# Patient Record
Sex: Male | Born: 1940 | Race: Black or African American | Hispanic: No | Marital: Single
Health system: Southern US, Community
[De-identification: ages and names within clinical notes are randomized; demographics above are authoritative.]

## PROBLEM LIST (undated history)

## (undated) DIAGNOSIS — C801 Malignant (primary) neoplasm, unspecified: Secondary | ICD-10-CM

## (undated) DIAGNOSIS — I10 Essential (primary) hypertension: Secondary | ICD-10-CM

## (undated) DIAGNOSIS — E119 Type 2 diabetes mellitus without complications: Secondary | ICD-10-CM

## (undated) HISTORY — PX: HERNIA REPAIR: SHX51

---

## 2017-09-18 ENCOUNTER — Encounter (HOSPITAL_COMMUNITY): Payer: Self-pay | Admitting: Emergency Medicine

## 2017-09-18 ENCOUNTER — Emergency Department (HOSPITAL_COMMUNITY): Payer: Medicare Other

## 2017-09-18 ENCOUNTER — Emergency Department (HOSPITAL_COMMUNITY)
Admission: EM | Admit: 2017-09-18 | Discharge: 2017-09-18 | Disposition: A | Discharge status: Home / Self Care | Payer: Medicare Other | Attending: Emergency Medicine | Admitting: Emergency Medicine

## 2017-09-18 DIAGNOSIS — H81399 Other peripheral vertigo, unspecified ear: Secondary | ICD-10-CM

## 2017-09-18 DIAGNOSIS — R42 Dizziness and giddiness: Secondary | ICD-10-CM | POA: Diagnosis present

## 2017-09-18 DIAGNOSIS — E119 Type 2 diabetes mellitus without complications: Secondary | ICD-10-CM | POA: Insufficient documentation

## 2017-09-18 DIAGNOSIS — R112 Nausea with vomiting, unspecified: Secondary | ICD-10-CM | POA: Insufficient documentation

## 2017-09-18 DIAGNOSIS — I10 Essential (primary) hypertension: Secondary | ICD-10-CM | POA: Diagnosis not present

## 2017-09-18 HISTORY — DX: Essential (primary) hypertension: I10

## 2017-09-18 HISTORY — DX: Type 2 diabetes mellitus without complications: E11.9

## 2017-09-18 HISTORY — DX: Malignant (primary) neoplasm, unspecified: C80.1

## 2017-09-18 LAB — URINALYSIS, ROUTINE W REFLEX MICROSCOPIC
BACTERIA UA: NONE SEEN
Bilirubin Urine: NEGATIVE
Glucose, UA: >=500 mg/dL — AB
HGB URINE DIPSTICK: NEGATIVE
KETONES UR: 5 mg/dL — AB
Leukocytes, UA: NEGATIVE
Nitrite: NEGATIVE
PROTEIN: NEGATIVE mg/dL
Specific Gravity, Urine: 1.017 (ref 1.005–1.030)
pH: 5 (ref 5.0–8.0)

## 2017-09-18 LAB — CBC
HEMATOCRIT: 47.9 % (ref 39.0–52.0)
HEMOGLOBIN: 16.4 g/dL (ref 13.0–17.0)
MCH: 28.8 pg (ref 26.0–34.0)
MCHC: 34.2 g/dL (ref 30.0–36.0)
MCV: 84 fL (ref 78.0–100.0)
Platelets: 283 10*3/uL (ref 150–400)
RBC: 5.7 MIL/uL (ref 4.22–5.81)
RDW: 14.2 % (ref 11.5–15.5)
WBC: 12.4 10*3/uL — ABNORMAL HIGH (ref 4.0–10.5)

## 2017-09-18 LAB — CBC WITH DIFFERENTIAL/PLATELET
BASOS ABS: 0 10*3/uL (ref 0.0–0.1)
Basophils Relative: 0 %
EOS PCT: 0 %
Eosinophils Absolute: 0 10*3/uL (ref 0.0–0.7)
HCT: 47.7 % (ref 39.0–52.0)
Hemoglobin: 16.1 g/dL (ref 13.0–17.0)
LYMPHS ABS: 2 10*3/uL (ref 0.7–4.0)
Lymphocytes Relative: 17 %
MCH: 28.5 pg (ref 26.0–34.0)
MCHC: 33.8 g/dL (ref 30.0–36.0)
MCV: 84.6 fL (ref 78.0–100.0)
MONO ABS: 0.7 10*3/uL (ref 0.1–1.0)
MONOS PCT: 6 %
Neutro Abs: 9.2 10*3/uL — ABNORMAL HIGH (ref 1.7–7.7)
Neutrophils Relative %: 77 %
Platelets: 294 10*3/uL (ref 150–400)
RBC: 5.64 MIL/uL (ref 4.22–5.81)
RDW: 14.1 % (ref 11.5–15.5)
WBC: 11.9 10*3/uL — ABNORMAL HIGH (ref 4.0–10.5)

## 2017-09-18 LAB — BASIC METABOLIC PANEL
ANION GAP: 14 (ref 5–15)
BUN: 23 mg/dL — AB (ref 6–20)
CHLORIDE: 99 mmol/L — AB (ref 101–111)
CO2: 23 mmol/L (ref 22–32)
Calcium: 9.5 mg/dL (ref 8.9–10.3)
Creatinine, Ser: 0.98 mg/dL (ref 0.61–1.24)
GFR calc Af Amer: >60 mL/min (ref >60)
GFR calc non Af Amer: >60 mL/min (ref >60)
GLUCOSE: 286 mg/dL — AB (ref 65–99)
POTASSIUM: 3.5 mmol/L (ref 3.5–5.1)
Sodium: 136 mmol/L (ref 135–145)

## 2017-09-18 LAB — I-STAT TROPONIN, ED
TROPONIN I, POC: 0.03 ng/mL (ref 0.00–0.08)
Troponin i, poc: 0 ng/mL (ref 0.00–0.08)

## 2017-09-18 MED ORDER — MECLIZINE HCL 25 MG PO TABS: 25.0000 mg | ORAL_TABLET | Freq: Three times a day (TID) | ORAL | 0 refills | Status: AC | PRN

## 2017-09-18 MED ORDER — ONDANSETRON HCL 4 MG/2ML IJ SOLN
4.0000 mg | INTRAMUSCULAR | Status: DC | PRN
  Administered 2017-09-18: 4 mg via INTRAVENOUS
  Filled 2017-09-18: qty 2

## 2017-09-18 MED ORDER — MECLIZINE HCL 25 MG PO TABS
25.0000 mg | ORAL_TABLET | Freq: Once | ORAL | Status: AC
  Administered 2017-09-18: 25 mg via ORAL
  Filled 2017-09-18: qty 1

## 2017-09-18 MED ORDER — SODIUM CHLORIDE 0.9 % IV BOLUS
500.0000 mL | Freq: Once | INTRAVENOUS | Status: AC
  Administered 2017-09-18: 500 mL via INTRAVENOUS

## 2017-09-18 MED ORDER — SODIUM CHLORIDE 0.9 % IV SOLN
INTRAVENOUS | Status: DC
  Administered 2017-09-18: 17:00:00 via INTRAVENOUS

## 2017-09-18 MED ORDER — ONDANSETRON 4 MG PO TBDP: 4.0000 mg | ORAL_TABLET | Freq: Three times a day (TID) | ORAL | 0 refills | Status: AC | PRN

## 2017-09-18 NOTE — ED Triage Notes (Signed)
Patient c/o SOB, dull central chest pain, dizziness, N/V since yesterday. Reports dizziness worsens with movement. Denies headache.

## 2017-09-18 NOTE — ED Notes (Signed)
Pt ambulated to and from the bathroom with no difficulty, does still complain of some dizziness and double vision when he watches TV but notices an improvement

## 2017-09-18 NOTE — Discharge Instructions (Addendum)
Take the prescriptions as directed.  Call your regular medical doctor tomorrow to schedule a follow up appointment within the next 2 days. Call the Eye doctor tomorrow to schedule a follow up appointment this week. Call the ENT doctor tomorrow to schedule a follow up appointment within the next week.  Return to the Emergency Department immediately sooner if worsening.

## 2017-09-18 NOTE — ED Provider Notes (Addendum)
[facility] DEPT Provider Note   CSN: [id number] Arrival date & time: 09/18/17  1129     History   Chief Complaint Chief Complaint  Patient presents with  . Shortness of Breath  . Dizziness    HPI Peter Olsen is a 77 y.o. male.  HPI  Pt was seen at 1605. Per pt, c/o unknown onset and persistence of constant "dizziness" that was noticed when he woke up yesterday morning. Pt describes the dizziness as "everything is spinning around," worsens when he moves his head or tries to walk. Has been associated with feeling "off balance" when walking, as well as several intermittent episodes of N/V. Pt states his CBG's have been in the "200's." Denies CP/palpitations, no SOB/cough, no abd pain, no diarrhea, no black or blood in stools or emesis, no fevers, no rash, no falls, no focal motor weakness, no tingling/numbness in extremities, no slurred speech, no facial droop.    Past Medical History:  Diagnosis Date  . Cancer ([place])   . Diabetes mellitus without complication ([place])   . Hypertension     There are no active problems to display for this patient.   Past Surgical History:  Procedure Laterality Date  . HERNIA REPAIR          Home Medications    Prior to Admission medications   Not on File    Family History No family history on file.  Social History Social History   Tobacco Use  . Smoking status: Not on file  Substance Use Topics  . Alcohol use: Not on file  . Drug use: Not on file     Allergies   Patient has no known allergies.   Review of Systems Review of Systems ROS: Statement: All systems negative except as marked or noted in the HPI; Constitutional: Negative for fever and chills. ; ; Eyes: Negative for eye pain, redness and discharge. ; ; ENMT: Negative for ear pain, hoarseness, nasal congestion, sinus pressure and sore throat. ; ; Cardiovascular: Negative for chest pain, palpitations, diaphoresis, dyspnea and peripheral  edema. ; ; Respiratory: Negative for cough, wheezing and stridor. ; ; Gastrointestinal: +N/V. Negative for diarrhea, abdominal pain, blood in stool, hematemesis, jaundice and rectal bleeding. . ; ; Genitourinary: Negative for dysuria, flank pain and hematuria. ; ; Musculoskeletal: Negative for back pain and neck pain. Negative for swelling and trauma.; ; Skin: Negative for pruritus, rash, abrasions, blisters, bruising and skin lesion.; ; Neuro: +"spinning." Negative for headache, lightheadedness and neck stiffness. Negative for weakness, altered level of consciousness, altered mental status, extremity weakness, paresthesias, involuntary movement, seizure and syncope.       Physical Exam Updated Vital Signs BP 131/78 (BP Location: Right Arm)   Pulse 62   Temp 97.8 F (36.6 C) (Oral)   Resp 18   SpO2 98%     16:27 Orthostatic Vital Signs AL  Orthostatic Lying   BP- Lying: 182/82   Pulse- Lying: 60   16:30 Orthostatic Vital Signs AL  Orthostatic Sitting  BP- Sitting: 168/99  Pulse- Sitting: 69   16:32 Orthostatic Vital Signs AL  Orthostatic Standing at 0 minutes  BP- Standing at 0 minutes: 156/80   Pulse- Standing at 0 minutes: 71     Physical Exam 1610: Physical examination:  Nursing notes reviewed; Vital signs and O2 SAT reviewed;  Constitutional: Well developed, Well nourished, Well hydrated, In no acute distress; Head:  Normocephalic, atraumatic; Eyes: EOMI, PERRL, No scleral icterus; ENMT: TM's clear bilat. +  edemetous nasal turbinates bilat with clear rhinorrhea. Mouth and pharynx normal, Mucous membranes moist; Neck: Supple, Full range of motion, No lymphadenopathy; Cardiovascular: Regular rate and rhythm, No gallop; Respiratory: Breath sounds clear & equal bilaterally, No wheezes.  Speaking full sentences with ease, Normal respiratory effort/excursion; Chest: Nontender, Movement normal; Abdomen: Soft, Nontender, Nondistended, Normal bowel sounds; Genitourinary: No CVA tenderness;  Extremities: Peripheral pulses normal, No tenderness, No edema, No calf edema or asymmetry.; Neuro: AA&Ox3, Major CN grossly intact. Speech clear.  No facial droop.  +left horizontal end gaze fatigable nystagmus. Grips equal. Strength 5/5 equal bilat UE's and LE's.  DTR 2/4 equal bilat UE's and LE's.  No gross sensory deficits.  Normal cerebellar testing bilat UE's (finger-nose) and LE's (heel-shin).; Skin: Color normal, Warm, Dry.   ED Treatments / Results  Labs (all labs ordered are listed, but only abnormal results are displayed)   EKG EKG Interpretation  Date/Time:  Monday September 18 2017 11:45:21 EDT Ventricular Rate:  66 PR Interval:    QRS Duration: 75 QT Interval:  421 QTC Calculation: 442 R Axis:   62 Text Interpretation:  Sinus rhythm Prolonged PR interval RSR' in V1 or V2, right VCD or RVH Baseline wander No old tracing to compare Confirmed by Francine Graven [phone]) on 09/18/2017 4:14:49 PM   Radiology   Procedures Procedures (including critical care time)  Medications Ordered in ED Medications  meclizine (ANTIVERT) tablet 25 mg (has no administration in time range)  ondansetron (ZOFRAN) injection 4 mg (has no administration in time range)  0.9 %  sodium chloride infusion (has no administration in time range)  sodium chloride 0.9 % bolus 500 mL (has no administration in time range)     Initial Impression / Assessment and Plan / ED Course  I have reviewed the triage vital signs and the nursing notes.  Pertinent labs & imaging results that were available during my care of the patient were reviewed by me and considered in my medical decision making (see chart for details).  MDM Reviewed: previous chart, nursing note and vitals Reviewed previous: labs and ECG Interpretation: labs, ECG, x-ray and MRI   Results for orders placed or performed during the hospital encounter of 60/45/40  Basic metabolic panel  Result Value Ref Range   Sodium 136 135 - 145 mmol/L    Potassium 3.5 3.5 - 5.1 mmol/L   Chloride 99 (L) 101 - 111 mmol/L   CO2 23 22 - 32 mmol/L   Glucose, Bld 286 (H) 65 - 99 mg/dL   BUN 23 (H) 6 - 20 mg/dL   Creatinine, Ser 0.98 0.61 - 1.24 mg/dL   Calcium 9.5 8.9 - 10.3 mg/dL   GFR calc non Af Amer >60 >60 mL/min   GFR calc Af Amer >60 >60 mL/min   Anion gap 14 5 - 15  CBC  Result Value Ref Range   WBC 12.4 (H) 4.0 - 10.5 K/uL   RBC 5.70 4.22 - 5.81 MIL/uL   Hemoglobin 16.4 13.0 - 17.0 g/dL   HCT 47.9 39.0 - 52.0 %   MCV 84.0 78.0 - 100.0 fL   MCH 28.8 26.0 - 34.0 pg   MCHC 34.2 30.0 - 36.0 g/dL   RDW 14.2 11.5 - 15.5 %   Platelets 283 150 - 400 K/uL  Urinalysis, Routine w reflex microscopic  Result Value Ref Range   Color, Urine YELLOW YELLOW   APPearance CLEAR CLEAR   Specific Gravity, Urine 1.017 1.005 - 1.030   pH 5.0  5.0 - 8.0   Glucose, UA >=500 (A) NEGATIVE mg/dL   Hgb urine dipstick NEGATIVE NEGATIVE   Bilirubin Urine NEGATIVE NEGATIVE   Ketones, ur 5 (A) NEGATIVE mg/dL   Protein, ur NEGATIVE NEGATIVE mg/dL   Nitrite NEGATIVE NEGATIVE   Leukocytes, UA NEGATIVE NEGATIVE   RBC / HPF 0-5 0 - 5 RBC/hpf   WBC, UA 0-5 0 - 5 WBC/hpf   Bacteria, UA NONE SEEN NONE SEEN   Squamous Epithelial / LPF 0-5 (A) NONE SEEN   Mucus PRESENT    Hyaline Casts, UA PRESENT   CBC with Differential/Platelet  Result Value Ref Range   WBC 11.9 (H) 4.0 - 10.5 K/uL   RBC 5.64 4.22 - 5.81 MIL/uL   Hemoglobin 16.1 13.0 - 17.0 g/dL   HCT 47.7 39.0 - 52.0 %   MCV 84.6 78.0 - 100.0 fL   MCH 28.5 26.0 - 34.0 pg   MCHC 33.8 30.0 - 36.0 g/dL   RDW 14.1 11.5 - 15.5 %   Platelets 294 150 - 400 K/uL   Neutrophils Relative % 77 %   Neutro Abs 9.2 (H) 1.7 - 7.7 K/uL   Lymphocytes Relative 17 %   Lymphs Abs 2.0 0.7 - 4.0 K/uL   Monocytes Relative 6 %   Monocytes Absolute 0.7 0.1 - 1.0 K/uL   Eosinophils Relative 0 %   Eosinophils Absolute 0.0 0.0 - 0.7 K/uL   Basophils Relative 0 %   Basophils Absolute 0.0 0.0 - 0.1 K/uL  I-stat troponin, ED    Result Value Ref Range   Troponin i, poc 0.03 0.00 - 0.08 ng/mL   Comment 3          I-stat troponin, ED  Result Value Ref Range   Troponin i, poc 0.00 0.00 - 0.08 ng/mL   Comment 3           Dg Chest 2 View Result Date: 09/18/2017 CLINICAL DATA:  Shortness of breath, dizziness, dull mid chest pain, and nausea vomiting since yesterday. History of hypertension and diabetes. EXAM: CHEST - 2 VIEW COMPARISON:  None in PACs FINDINGS: The lungs are adequately inflated. The interstitial markings are coarse especially at the lung bases. The heart is top-normal in size. The pulmonary vascularity is normal. The mediastinum is normal in width. The bony thorax exhibits no acute abnormality. IMPRESSION: Mild interstitial prominence likely reflects chronic bronchitis. There is no alveolar pneumonia nor pulmonary edema. Electronically Signed   By: David  Martinique M.D.   On: 09/18/2017 12:10   Mr Brain Wo Contrast (neuro Protocol) Result Date: 09/18/2017 CLINICAL DATA:  Vertigo, episodic. EXAM: MRI HEAD WITHOUT CONTRAST TECHNIQUE: Multiplanar, multiecho pulse sequences of the brain and surrounding structures were obtained without intravenous contrast. COMPARISON:  None. FINDINGS: Brain: No evidence for acute infarction, hemorrhage, mass lesion, hydrocephalus, or extra-axial fluid. Normal for age cerebral volume. Mild subcortical and periventricular T2 and FLAIR hyperintensities, likely chronic microvascular ischemic change. There does not appear to be significant white matter change involving the brainstem, nor is there evidence for prior brainstem infarction. Vascular: Flow voids are maintained throughout the carotid, basilar, and vertebral arteries. There are no areas of chronic hemorrhage. LEFT vertebral dominant. Skull and upper cervical spine: Unremarkable visualized calvarium, skullbase, and cervical vertebrae. Pituitary, pineal, cerebellar tonsils unremarkable. No upper cervical cord lesions. Sinuses/Orbits:  Negative. Other: No temporal bone inflammatory process is evident. Extracranial soft tissues appear unremarkable. IMPRESSION: Noncontrast examination of the brain, demonstrating mild atrophy and small vessel disease. No acute  intracranial findings. Electronically Signed   By: Staci Righter M.D.   On: 09/18/2017 19:08    2015:  Pt denies CP to me; but troponin x2 normal and EKG without acute STTW changes. MRI reassuring. Pt given PO antivert and then ambulated. Pt states his symptoms are better. States he "still feels the spinning a little bit and the double vision still happens." Pt states the double vision (sees 2 images side to side) occurs when the spinning occurs, resolves when the spinning does and when he covers one eye. Denies eye pain, no headache, no FB sensation. Pt has tol PO well while in the ED without N/V. T/C returned from Neuro Dr. Cheral Marker, case discussed, including:  HPI, pertinent PM/SHx, VS/PE, dx testing, ED course and treatment:  Agrees negative MRI has ruled out acute stroke and TIA, no further emergent testing needed at this time, continue to tx for peripheral vertigo, f/u ENT MD.   2030:  T/C returned from Ophtho Dr. Kristeen Miss, case discussed, including:  HPI, pertinent PM/SHx, VS/PE, dx testing, ED course and treatment:  Agrees that with reassuring MRI, no further imaging or dx testing needed at this time, double vision d/t peripheral vertigo or possible misalignment, tx vertigo and f/u in eye clinic this week as well as with ENT MD. Dx and testing d/w pt and family.  Questions answered.  Verb understanding, agreeable to d/c home with outpt f/u.     Final Clinical Impressions(s) / ED Diagnoses   Final diagnoses:  None    ED Discharge Orders    None          Francine Graven, DO 09/20/17 1826

## 2019-06-24 IMAGING — CR DG CHEST 2V
3 series · 3 of 3 positions shown · non-contrast
Comparison: None in PACs

CLINICAL DATA: Shortness of breath, dizziness, dull mid chest pain,
and nausea vomiting since yesterday. History of hypertension and
diabetes.

EXAM:
CHEST - 2 VIEW

[w chest lat (1 of 2)]
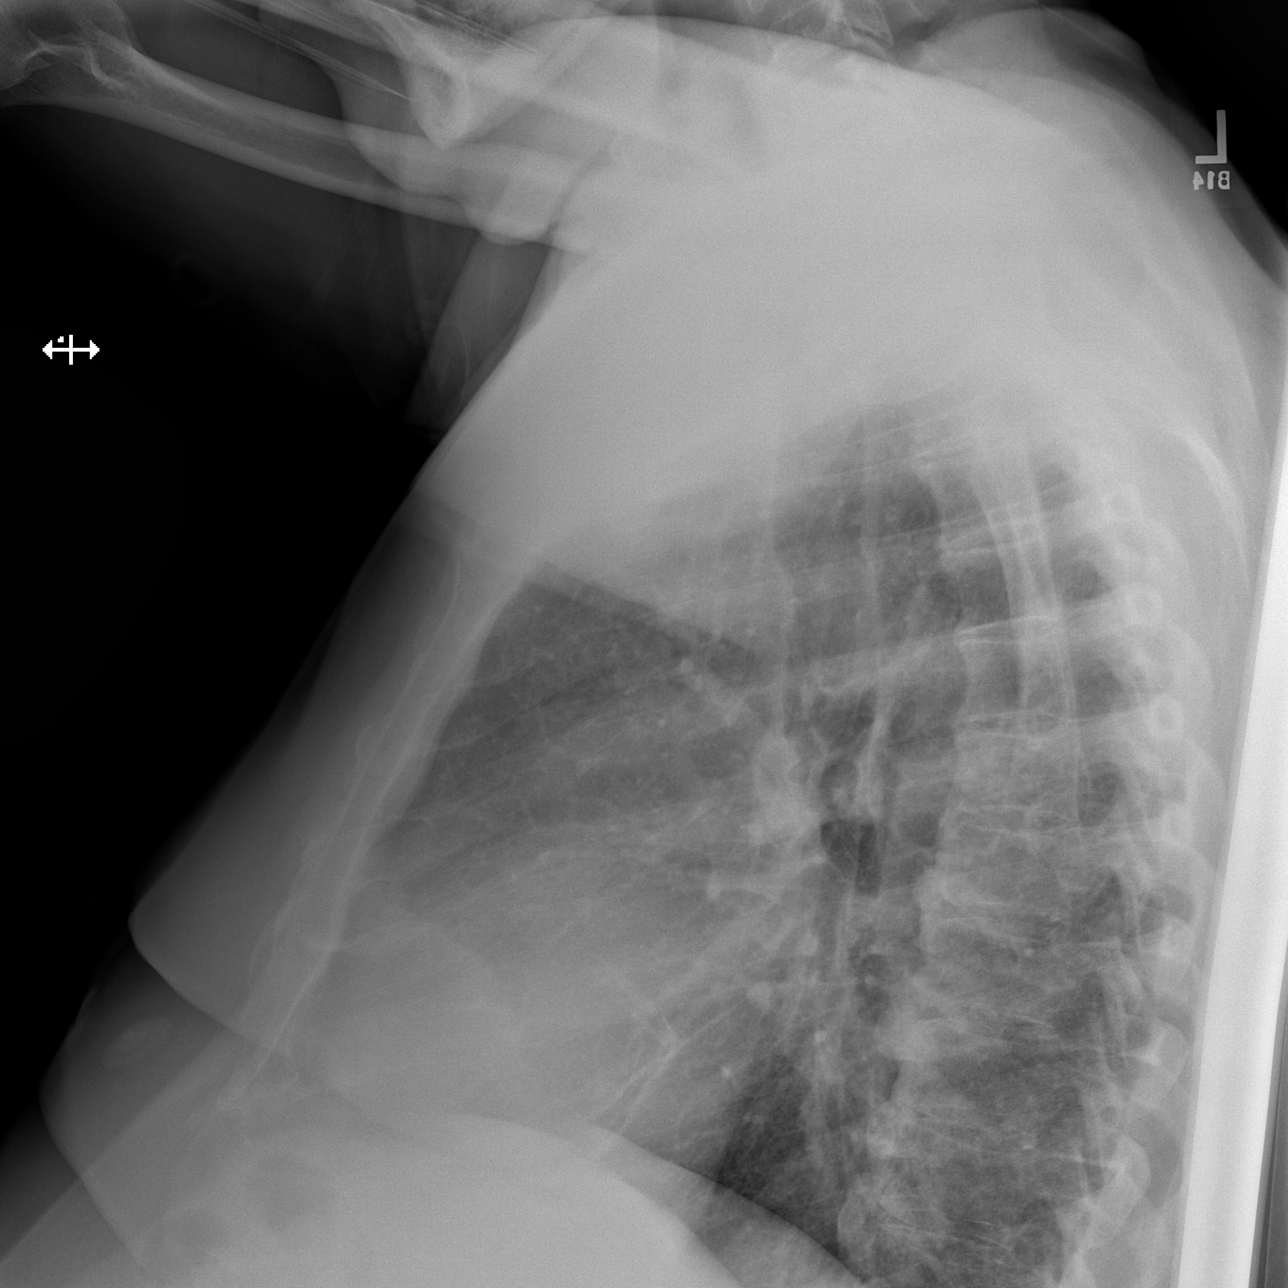

[w chest lat (2 of 2)]
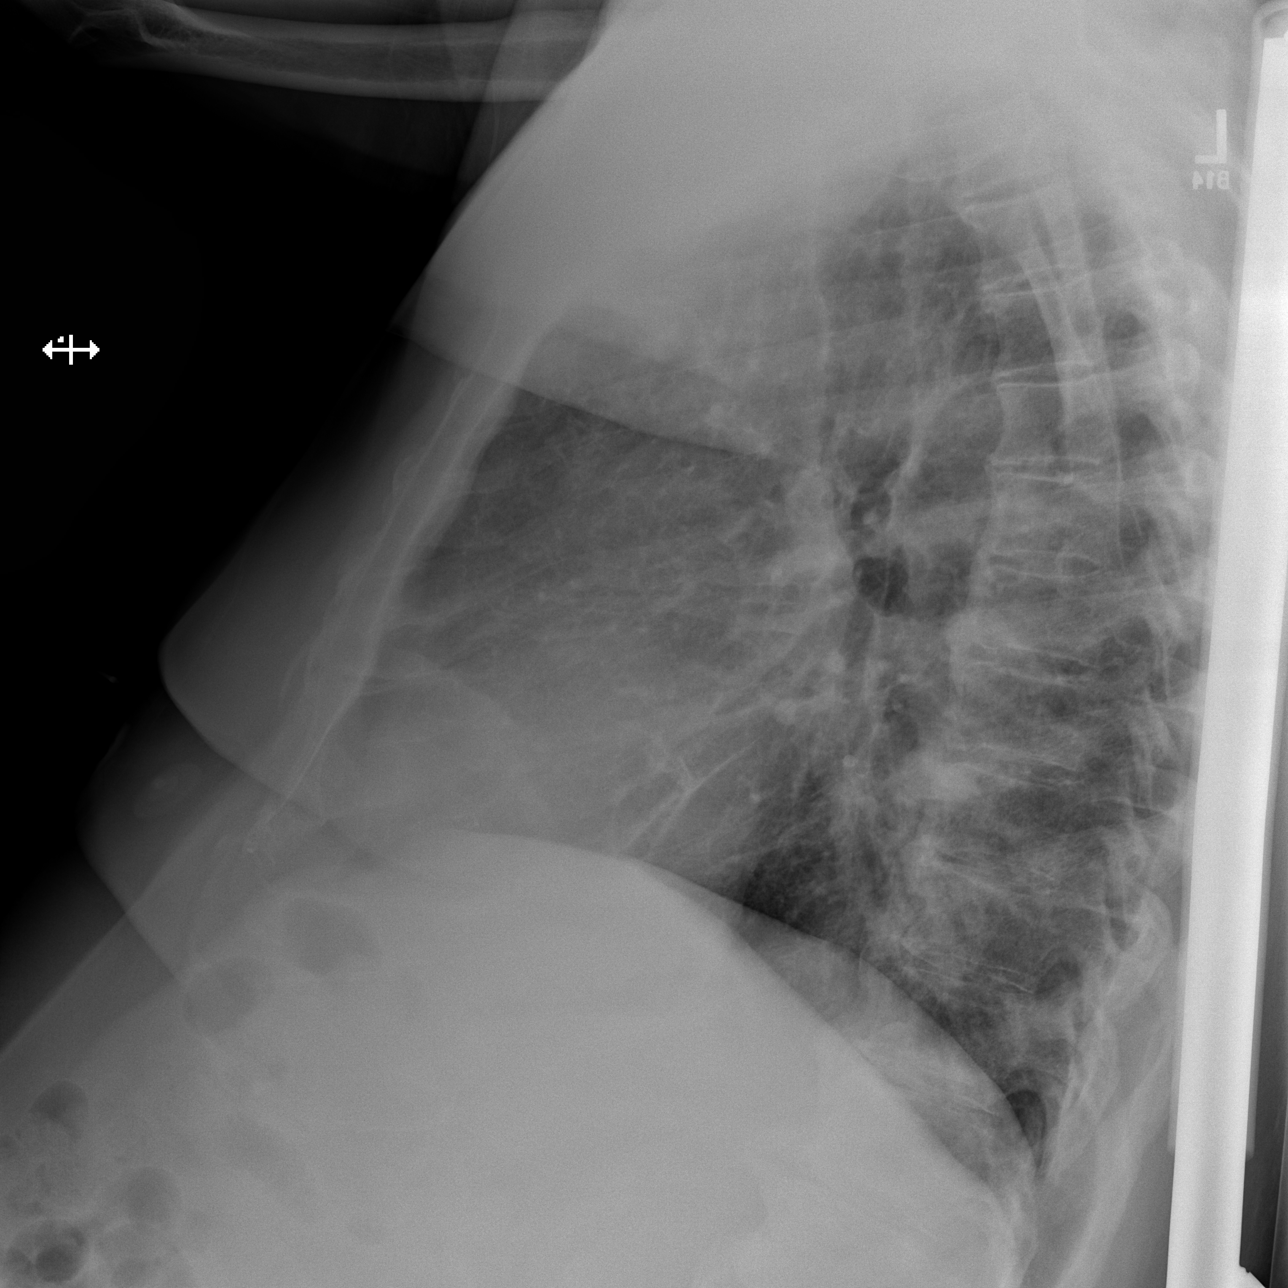

[x chest ap]
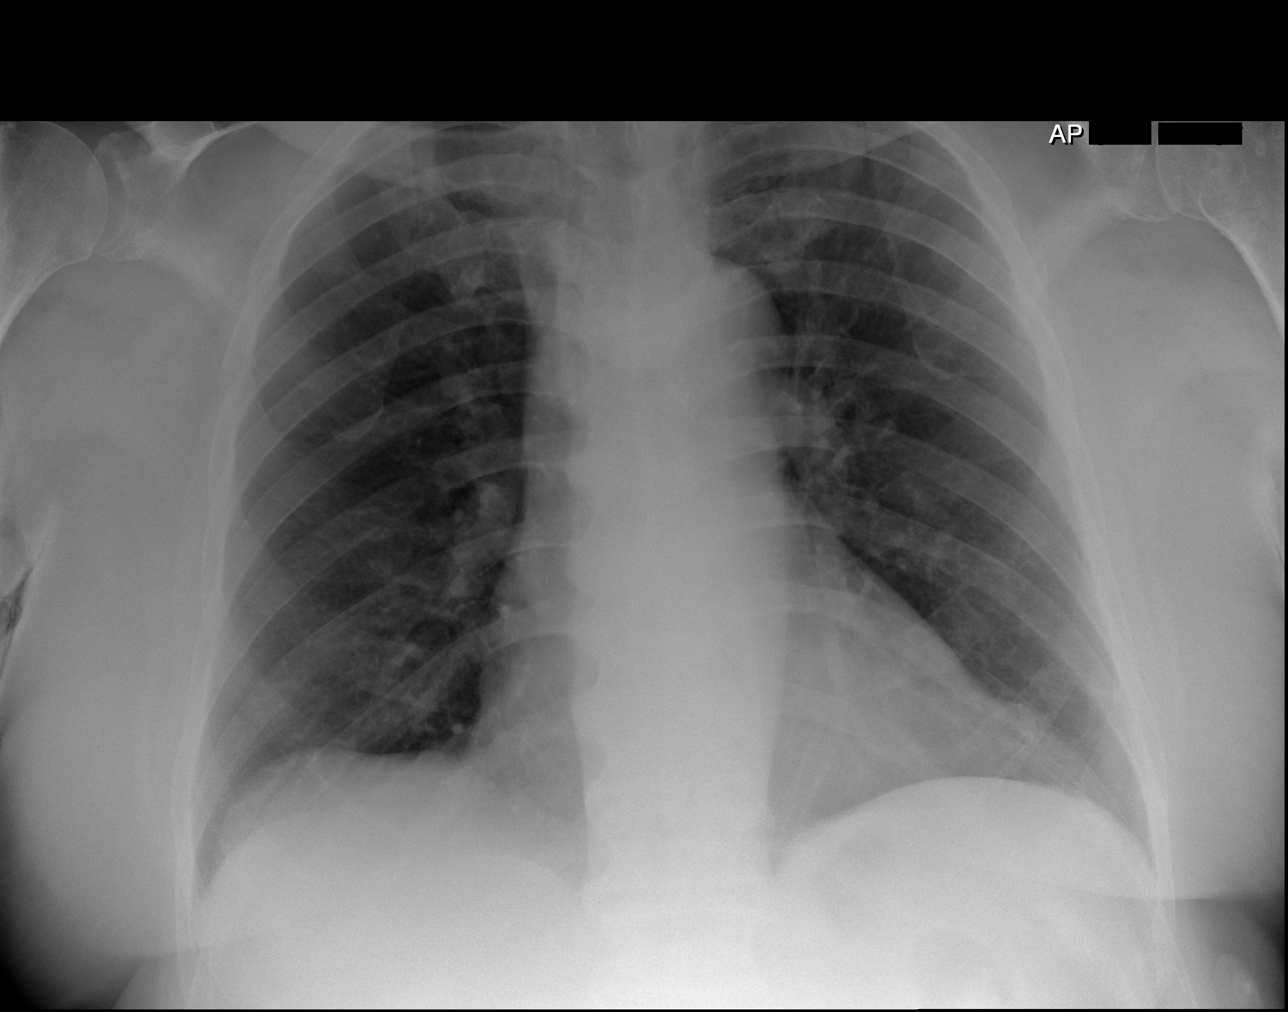

[3 of 3 positions shown; findings below may reference images not displayed]

FINDINGS: The lungs are adequately inflated. The interstitial markings are
coarse especially at the lung bases. The heart is top-normal in
size. The pulmonary vascularity is normal. The mediastinum is normal
in width. The bony thorax exhibits no acute abnormality.
IMPRESSION: Mild interstitial prominence likely reflects chronic bronchitis.
There is no alveolar pneumonia nor pulmonary edema.

## 2019-06-24 IMAGING — MR MR HEAD W/O CM
9 of 10 series · 38 of 48 positions shown · non-contrast
Comparison: None.

CLINICAL DATA: Vertigo, episodic.

EXAM:
MRI HEAD WITHOUT CONTRAST
TECHNIQUE: Multiplanar, multiecho pulse sequences of the brain and surrounding
structures were obtained without intravenous contrast.

[Series 3: DWI · axial · 3.0mm · 1.09mm/px · z∈[-101,+52]mm · 9 of 104 slices shown (1 of 4)]
[im 1/104]
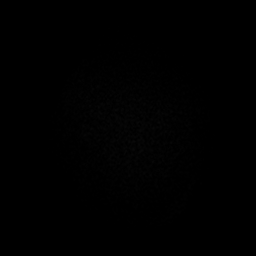
[im 19/104]
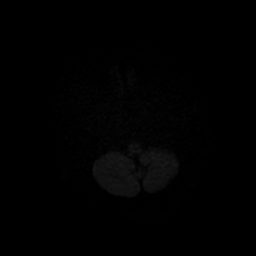
[im 29/104]
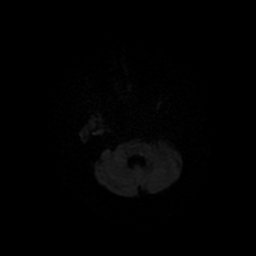
[im 47/104]
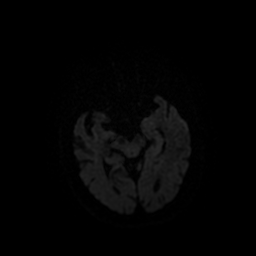
[im 57/104]
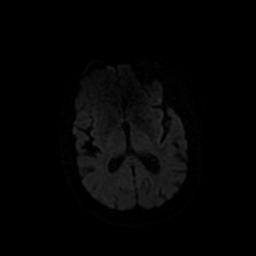
[im 75/104]
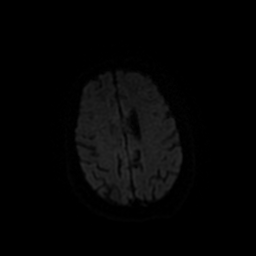
[im 85/104]
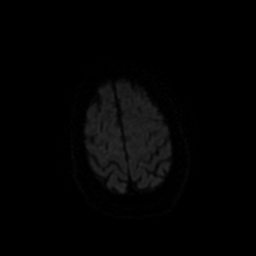
[im 94/104]
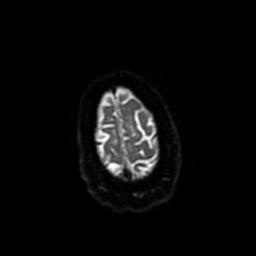
[im 104/104]
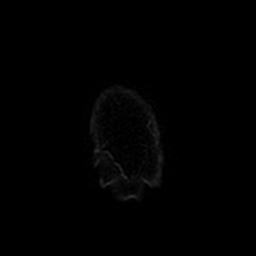

[Series 4: T1 · sagittal · 5.0mm · 0.47mm/px · 3 of 24 slices shown]
[im 1/24]
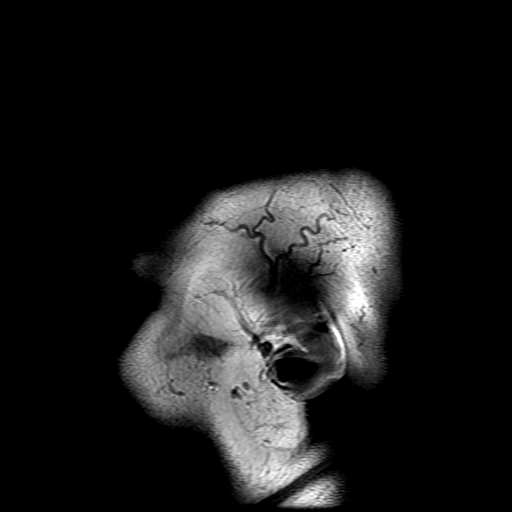
[im 12/24]
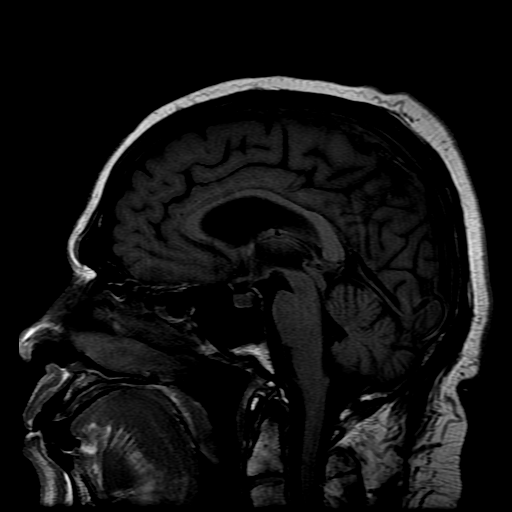
[im 24/24]
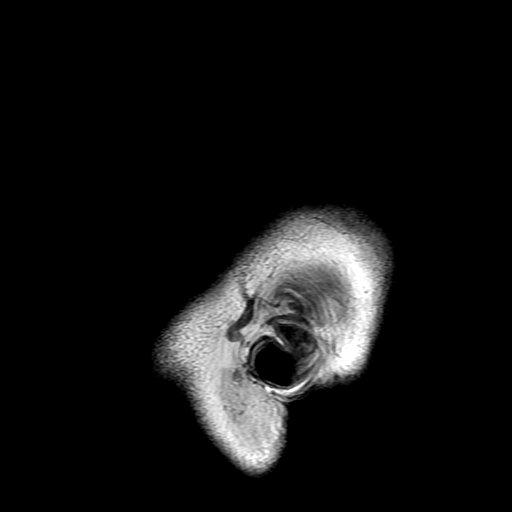

[Series 5: DWI · coronal · 5.0mm · 1.09mm/px · 8 of 72 slices shown (2 of 4)]
[im 1/72]
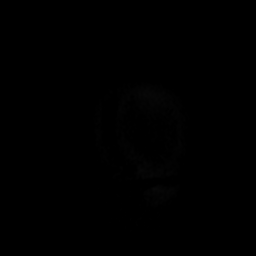
[im 11/72]
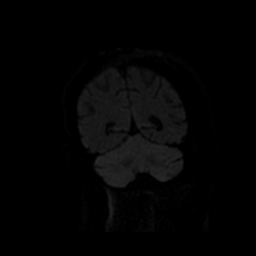
[im 21/72]
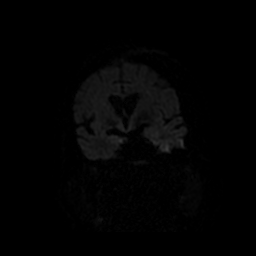
[im 31/72]
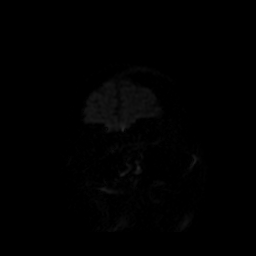
[im 41/72]
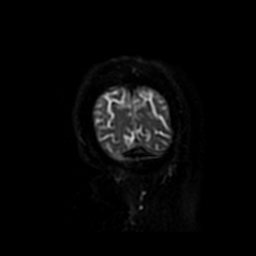
[im 51/72]
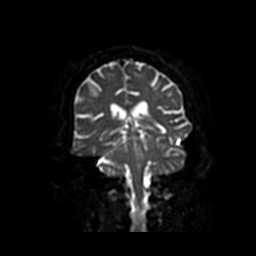
[im 61/72]
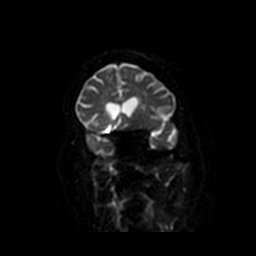
[im 72/72]
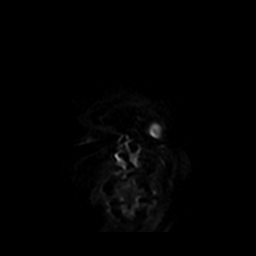

[Series 6: T2 · axial · 5.0mm · 0.43mm/px · z∈[-88,+66]mm · 2 of 23 slices shown (1 of 2)]
[im 1/23]
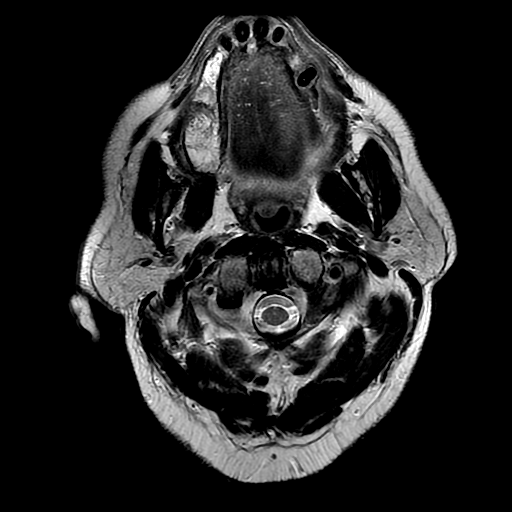
[im 23/23]
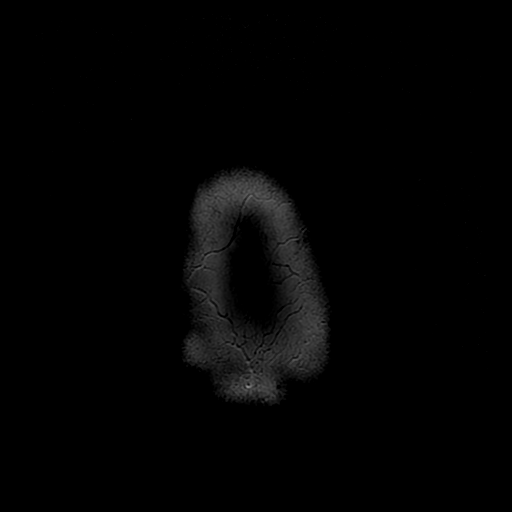

[Series 7: FLAIR · axial · 5.0mm · 0.43mm/px · z∈[-88,+66]mm · 2 of 23 slices shown]
[im 1/23]
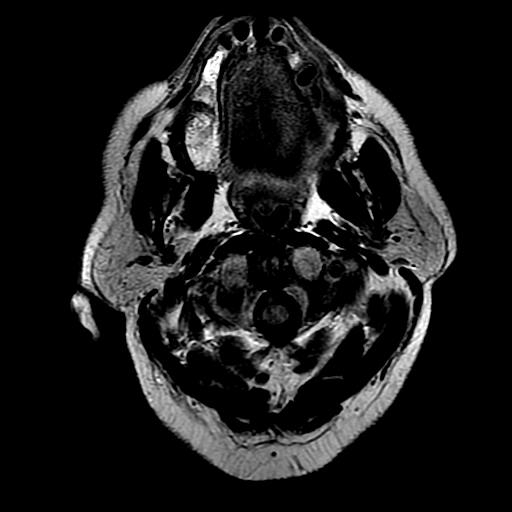
[im 23/23]
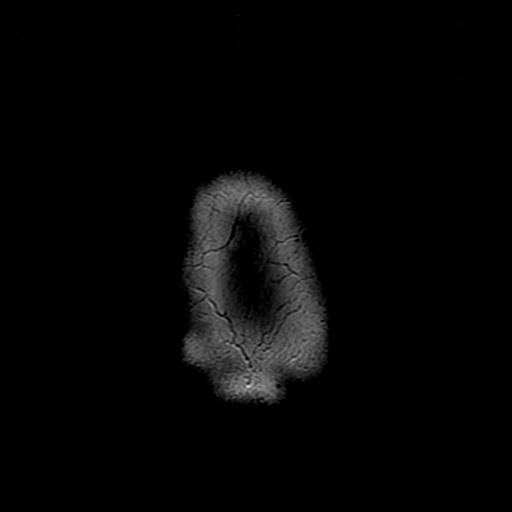

[Series 8: ax mpgr · axial · 5.0mm · 0.43mm/px · 1 of 23 slices shown]
[im 1/23]
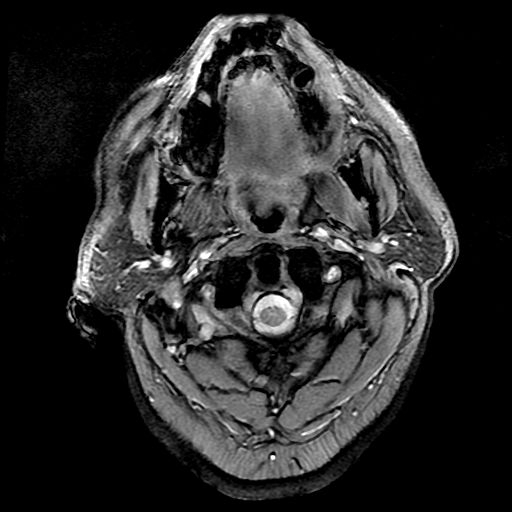

[Series 10: T2 · coronal · 5.0mm · 0.45mm/px · 3 of 29 slices shown (2 of 2)]
[im 1/29]
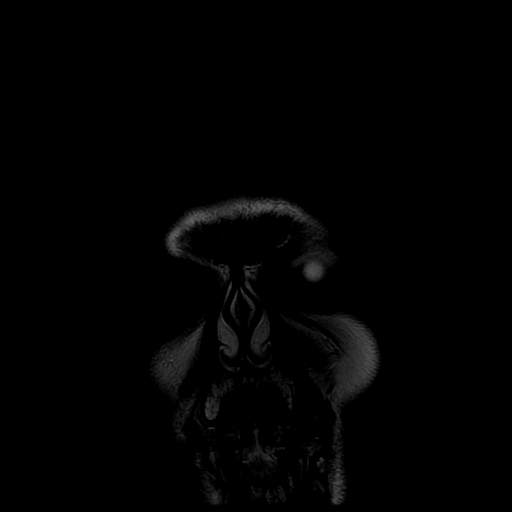
[im 15/29]
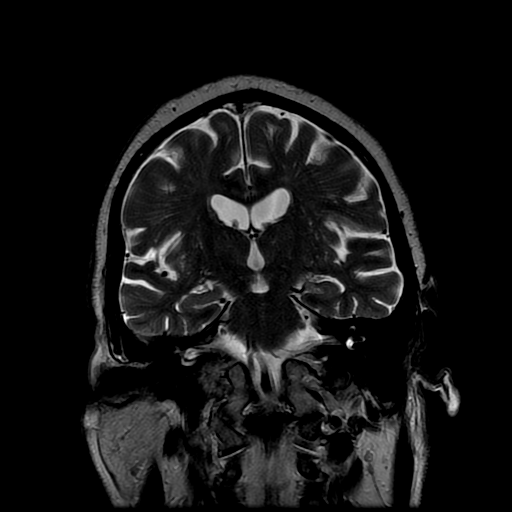
[im 29/29]
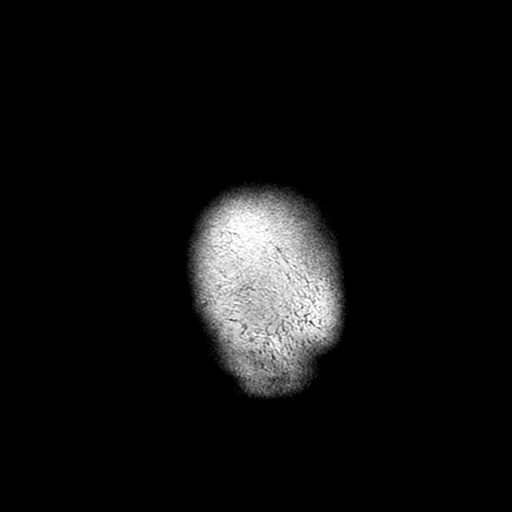

[Series 300: DWI · axial · 3.0mm · 1.09mm/px · z∈[-101,+52]mm · 6 of 52 slices shown (3 of 4)]
[im 1/52]
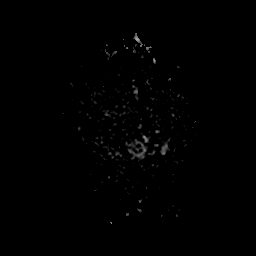
[im 11/52]
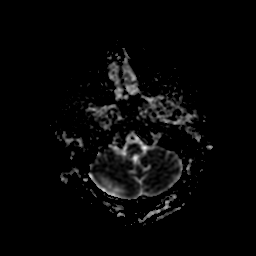
[im 21/52]
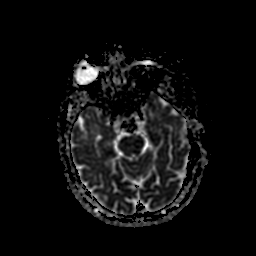
[im 31/52]
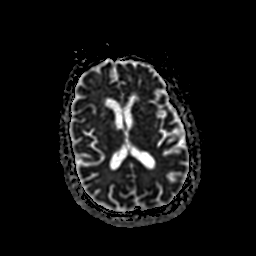
[im 41/52]
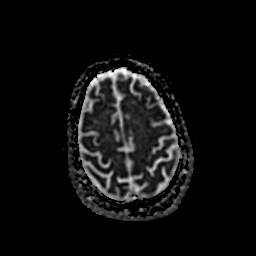
[im 52/52]
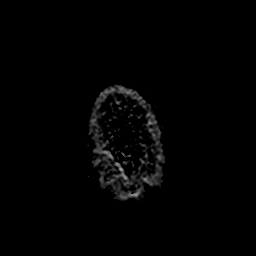

[Series 500: DWI · coronal · 5.0mm · 1.09mm/px · 4 of 36 slices shown (4 of 4)]
[im 1/36]
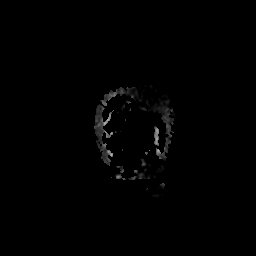
[im 12/36]
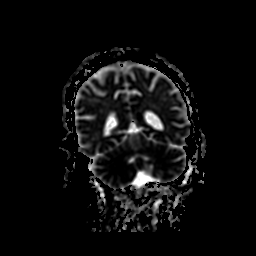
[im 24/36]
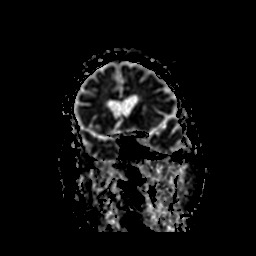
[im 36/36]
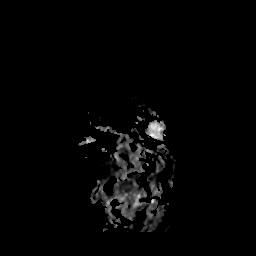

[38 of 48 positions shown; findings below may reference images not displayed]

FINDINGS: Brain: No evidence for acute infarction, hemorrhage, mass lesion,
hydrocephalus, or extra-axial fluid. Normal for age cerebral volume.
Mild subcortical and periventricular T2 and FLAIR hyperintensities,
likely chronic microvascular ischemic change. There does not appear
to be significant white matter change involving the brainstem, nor
is there evidence for prior brainstem infarction.

Vascular: Flow voids are maintained throughout the carotid, basilar,
and vertebral arteries. There are no areas of chronic hemorrhage.
LEFT vertebral dominant.

Skull and upper cervical spine: Unremarkable visualized calvarium,
skullbase, and cervical vertebrae. Pituitary, pineal, cerebellar
tonsils unremarkable. No upper cervical cord lesions.

Sinuses/Orbits: Negative.

Other: No temporal bone inflammatory process is evident.
Extracranial soft tissues appear unremarkable.
IMPRESSION: Noncontrast examination of the brain, demonstrating mild atrophy and
small vessel disease. No acute intracranial findings.
